# Patient Record
Sex: Male | Born: 1985 | Hispanic: No | Marital: Single | Smoking: Current every day smoker
Health system: Southern US, Community
[De-identification: ages and names within clinical notes are randomized; demographics above are authoritative.]

---
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
## 2005-01-29 ENCOUNTER — Emergency Department (HOSPITAL_COMMUNITY): Admission: EM | Admit: 2005-01-29 | Discharge: 2005-01-29 | Payer: Self-pay | Admitting: Emergency Medicine

## 2005-02-18 ENCOUNTER — Emergency Department (HOSPITAL_COMMUNITY): Admission: EM | Admit: 2005-02-18 | Discharge: 2005-02-18 | Payer: Self-pay | Admitting: Family Medicine

## 2005-02-27 ENCOUNTER — Ambulatory Visit (HOSPITAL_COMMUNITY): Admission: RE | Admit: 2005-02-27 | Discharge: 2005-02-27 | Payer: Self-pay | Admitting: Orthopaedic Surgery

## 2006-02-07 ENCOUNTER — Emergency Department (HOSPITAL_COMMUNITY): Admission: EM | Admit: 2006-02-07 | Discharge: 2006-02-07 | Payer: Self-pay | Admitting: Family Medicine

## 2006-04-12 ENCOUNTER — Emergency Department (HOSPITAL_COMMUNITY): Admission: EM | Admit: 2006-04-12 | Discharge: 2006-04-12 | Payer: Self-pay | Admitting: Family Medicine

## 2007-09-02 ENCOUNTER — Emergency Department (HOSPITAL_COMMUNITY): Admission: EM | Admit: 2007-09-02 | Discharge: 2007-09-02 | Payer: Self-pay | Admitting: Emergency Medicine

## 2007-11-30 ENCOUNTER — Emergency Department (HOSPITAL_COMMUNITY): Admission: EM | Admit: 2007-11-30 | Discharge: 2007-11-30 | Payer: Self-pay | Admitting: Emergency Medicine

## 2008-08-14 ENCOUNTER — Emergency Department (HOSPITAL_COMMUNITY): Admission: EM | Admit: 2008-08-14 | Discharge: 2008-08-14 | Payer: Self-pay | Admitting: Family Medicine

## 2008-11-21 ENCOUNTER — Emergency Department (HOSPITAL_COMMUNITY): Admission: EM | Admit: 2008-11-21 | Discharge: 2008-11-21 | Payer: Self-pay | Admitting: Family Medicine

## 2009-01-16 ENCOUNTER — Emergency Department (HOSPITAL_COMMUNITY): Admission: EM | Admit: 2009-01-16 | Discharge: 2009-01-16 | Payer: Self-pay | Admitting: Emergency Medicine

## 2009-10-14 ENCOUNTER — Emergency Department (HOSPITAL_COMMUNITY): Admission: EM | Admit: 2009-10-14 | Discharge: 2009-10-14 | Payer: Self-pay | Admitting: Family Medicine

## 2010-04-17 ENCOUNTER — Emergency Department (HOSPITAL_COMMUNITY): Admission: EM | Admit: 2010-04-17 | Discharge: 2010-04-17 | Payer: Self-pay | Admitting: Family Medicine

## 2010-09-12 LAB — POCT URINALYSIS DIPSTICK
Bilirubin Urine: NEGATIVE
Glucose, UA: NEGATIVE mg/dL
Hgb urine dipstick: NEGATIVE
Nitrite: NEGATIVE
Protein, ur: NEGATIVE mg/dL
Specific Gravity, Urine: 1.02 (ref 1.005–1.030)
Urobilinogen, UA: 1 mg/dL (ref 0.0–1.0)
pH: 7 (ref 5.0–8.0)

## 2010-09-12 LAB — GC/CHLAMYDIA PROBE AMP, GENITAL
Chlamydia, DNA Probe: NEGATIVE
GC Probe Amp, Genital: NEGATIVE

## 2010-10-15 ENCOUNTER — Inpatient Hospital Stay (INDEPENDENT_AMBULATORY_CARE_PROVIDER_SITE_OTHER)
Admission: RE | Admit: 2010-10-15 | Discharge: 2010-10-15 | Disposition: A | Discharge status: Home / Self Care | Payer: 59 | Source: Ambulatory Visit | Attending: Emergency Medicine | Admitting: Emergency Medicine

## 2010-10-15 DIAGNOSIS — J40 Bronchitis, not specified as acute or chronic: Secondary | ICD-10-CM

## 2010-10-15 DIAGNOSIS — J019 Acute sinusitis, unspecified: Secondary | ICD-10-CM

## 2010-10-29 ENCOUNTER — Emergency Department (HOSPITAL_COMMUNITY): Payer: 59

## 2010-10-29 ENCOUNTER — Emergency Department (HOSPITAL_COMMUNITY)
Admission: EM | Admit: 2010-10-29 | Discharge: 2010-10-30 | Disposition: A | Discharge status: Home / Self Care | Payer: 59 | Attending: Emergency Medicine | Admitting: Emergency Medicine

## 2010-10-29 DIAGNOSIS — M79609 Pain in unspecified limb: Secondary | ICD-10-CM | POA: Insufficient documentation

## 2010-10-29 DIAGNOSIS — IMO0002 Reserved for concepts with insufficient information to code with codable children: Secondary | ICD-10-CM | POA: Insufficient documentation

## 2010-10-29 DIAGNOSIS — S1093XA Contusion of unspecified part of neck, initial encounter: Secondary | ICD-10-CM | POA: Insufficient documentation

## 2010-10-29 DIAGNOSIS — R51 Headache: Secondary | ICD-10-CM | POA: Insufficient documentation

## 2010-10-29 DIAGNOSIS — S62309A Unspecified fracture of unspecified metacarpal bone, initial encounter for closed fracture: Secondary | ICD-10-CM | POA: Insufficient documentation

## 2010-10-29 DIAGNOSIS — S0003XA Contusion of scalp, initial encounter: Secondary | ICD-10-CM | POA: Insufficient documentation

## 2010-10-29 DIAGNOSIS — M25519 Pain in unspecified shoulder: Secondary | ICD-10-CM | POA: Insufficient documentation

## 2010-10-29 DIAGNOSIS — J45909 Unspecified asthma, uncomplicated: Secondary | ICD-10-CM | POA: Insufficient documentation

## 2010-10-29 LAB — POCT I-STAT, CHEM 8
BUN: 9 mg/dL (ref 6–23)
Calcium, Ion: 1.06 mmol/L — ABNORMAL LOW (ref 1.12–1.32)
Chloride: 109 mEq/L (ref 96–112)
Creatinine, Ser: 1 mg/dL (ref 0.4–1.5)
Glucose, Bld: 115 mg/dL — ABNORMAL HIGH (ref 70–99)
TCO2: 23 mmol/L (ref 0–100)

## 2010-11-09 ENCOUNTER — Ambulatory Visit (HOSPITAL_BASED_OUTPATIENT_CLINIC_OR_DEPARTMENT_OTHER)
Admission: RE | Admit: 2010-11-09 | Discharge: 2010-11-09 | Disposition: A | Discharge status: Home / Self Care | Payer: 59 | Source: Ambulatory Visit | Attending: Orthopedic Surgery | Admitting: Orthopedic Surgery

## 2010-11-09 DIAGNOSIS — X58XXXA Exposure to other specified factors, initial encounter: Secondary | ICD-10-CM | POA: Insufficient documentation

## 2010-11-09 DIAGNOSIS — S62309A Unspecified fracture of unspecified metacarpal bone, initial encounter for closed fracture: Secondary | ICD-10-CM | POA: Insufficient documentation

## 2010-11-09 DIAGNOSIS — Z01812 Encounter for preprocedural laboratory examination: Secondary | ICD-10-CM | POA: Insufficient documentation

## 2010-11-09 LAB — POCT HEMOGLOBIN-HEMACUE: Hemoglobin: 14.1 g/dL (ref 13.0–17.0)

## 2010-11-11 NOTE — Op Note (Signed)
  NAMEBRANDO, Bradley Patterson             ACCOUNT NO.:  [id number]  MEDICAL RECORD NO.:  [id number]           PATIENT TYPE:  LOCATION:                                 FACILITY:  PHYSICIAN:  Artist Pais. Jerimyah Vandunk, M.D.DATE OF BIRTH:  [birth year]  DATE OF PROCEDURE:  11/09/2010 DATE OF DISCHARGE:                              OPERATIVE REPORT   PREOPERATIVE DIAGNOSIS:  Displaced left long ring finger metacarpal fractures.  POSTOPERATIVE DIAGNOSIS:  Displaced left long ring finger metacarpal fractures.  PROCEDURE:  Open reduction and internal fixation of the long and ring metacarpal fractures, left hand with 1.5-mm lag screws and neutralization plate.  SURGEON:  Artist Pais. Mina Marble, MD  ASSISTANT:  None.  ANESTHESIA:  General.  TOURNIQUET TIME:  One hour and 43 minutes.  COMPLICATIONS:  None.  DRAINS:  None.  PROCEDURE:  The patient was taken to the operating suite, after induction of adequate general anesthesia, left upper extremity was prepped and draped in usual sterile fashion.  An Esmarch was used to exsanguinate the limb.  Tourniquet was then inflated to 250 mmHg.  At this point in time, incision was made on the dorsal aspect of his left hand in an S fashion.  Flaps were raised accordingly.  Interval between the long and ring metacarpals was dissected down to the extensor mechanism.  This was carefully retracted and the periosteum over the long metacarpal was incised.  Fracture site was identified.  There was a complicated spiral oblique fracture of the metacarpal with comminution. The lag screws were placed initially followed by neutralization plate on the radial side.  Intraoperative fluoroscopy revealed adequate reduction in AP lateral and oblique view.  The incision was made over the periosteum of the ring metacarpal and the same procedure was performed using 1.5-mm lag screws and neutralization plate as well as 1.3 mm lag screw from ulnar to radial.  The wound was then  thoroughly irrigated. Intraoperative fluoroscopy revealed adequate reduction in AP lateral and oblique view.  The periosteum was closed with 4-0 Vicryl and the skin with a running 4-0 Vicryl Rapide subcuticular stitch.  Steri-Strips, 4x4s, fluffs and volar splint were applied.  The patient tolerated the procedure well and went to recovery room in stable fashion.     Artist Pais Mina Marble, M.D.     MAW/MEDQ  D:  11/09/2010  T:  11/10/2010  Job:  [id number]  Electronically Signed by Dairl Ponder M.D. on 11/11/2010 06:53:59 PM

## 2010-11-16 NOTE — Op Note (Signed)
NAMEMIRZA, FESSEL             ACCOUNT NO.:  [id number]   MEDICAL RECORD NO.:  [id number]          PATIENT TYPE:  AMB   LOCATION:  SDS                          FACILITY:  [facility]   PHYSICIAN:  Mark C. Ophelia Charter, M.D.    DATE OF BIRTH:  1985-08-29   DATE OF PROCEDURE:  02/27/2005  DATE OF DISCHARGE:  02/27/2005                                 OPERATIVE REPORT   PREOPERATIVE DIAGNOSIS:  Right fifth metacarpal shaft fracture.   POSTOPERATIVE DIAGNOSIS:  Right fifth metacarpal shaft fracture.   OPERATION PERFORMED:  Open reduction internal fixation of right fifth  metacarpal shaft fracture.   SURGEON:  Mark C. Ophelia Charter, M.D.   ANESTHESIA:  MAC.   ESTIMATED BLOOD LOSS:  Minimal.   TOURNIQUET TIME:  Less than 30 minutes.   DESCRIPTION OF PROCEDURE:  After induction of general anesthesia,  orotracheal intubation, standard prepping and draping with extremity sheets  and drapes, hand was wrapped with an Esmarch and antibiotics were given.  Incision was made in line with the fifth metacarpal, subperiosteal  dissection was performed after mobilization of the extensor tendon with baby  Homan retractors.  The shaft was reduced, held and a quarter tubular plate,  4-hole, was selected and cortical screws 2 x 10 mm screws that were 10 mm in  length were used times four for fixation.  It was checked under fluoroscopy,  was anatomic position, rotation looked good.  After irrigation, subcutaneous  tissue reapproximated, skin closed and postoperative dressing was applied  with the wrist extended, MCPs flexed and postoperatively placed in a hand  elevator sling.  Intraoperative fluoroscopy pictures were taken.  The  fracture prior to being plated was held with K-wire and after the screws  were inserted, the K-wire was removed.  The fracture was anatomic.  Instrument count and needle count were correct.      Mark C. Ophelia Charter, M.D.  Electronically Signed     MCY/MEDQ  D:  04/03/2005  T:  04/04/2005   Job:  [id number]

## 2010-11-16 NOTE — H&P (Signed)
NAME:  SIMS, LADAY NO.:  [id number]   MEDICAL RECORD NO.:  [id number]                   PATIENT TYPE:  AMB   LOCATION:  DSC                                  FACILITY:  [facility]   PHYSICIAN:  Hermelinda Medicus, M.D.                DATE OF BIRTH:  Aug 24, 1985   DATE OF ADMISSION:  06/27/2003  DATE OF DISCHARGE:                                HISTORY & PHYSICAL   HISTORY OF PRESENT ILLNESS:  This patient is a 25 year old male who has had  history of bilateral myringotomy and tubes years ago.  He has had some at a  later date at age 39, but more recently has had persistent tonsillitis.  He  has been on antibiotics on several occasions and has also had a sleep apnea  problem.  His past history is one of blockage when he sleeps with some  fatigue with some awakening with severe oral irritation.  Tonsils are quite  large.  He has a very narrow oropharynx.  His mom works for a cardiovascular  over at [employer] and has identified this quite carefully.  We, however, felt  that with his age and with not having any abnormal structural problems, that  a polysomnogram is unnecessary.  He also has had considerable tonsillar  problems where he has been on antibiotics, primarily amoxicillin, Augmentin  and at this point he now comes in because of this repetitive problem for a  tonsillectomy.   PAST MEDICAL HISTORY:  His past history is quite unremarkable in the fact  that he had a BMT in March of 2004 and also in 2002.   MEDICATIONS:  He takes no medications at this time.   PHYSICAL EXAMINATION:  VITAL SIGNS:  His weight is now 245 and now is 267.  Blood pressure 128/72, respirations 20, pulse 75.  HEENT:  Ears are clear.  Oral cavity is clear except he has the large  obstructive tonsils.  His larynx, true cords, false cords, epiglottis, base  of tongue are clear of any ulceration or mass.  NECK:  Free of any thyromegaly, cervical adenopathy or mass.  CHEST:  Clear.  No  rales, rhonchi or wheezes.  CARDIOVASCULAR:  __________ murmurs or gallops.  ABDOMEN:  No organomegaly, tenderness or mass.  Obese.  EXTREMITIES:  Unremarkable.   PLAN:  The patient will be able to be discharged as long as he is observed  here in the recovery room and mom is going to be taking close care of him,  wishes to have him go home.   DIAGNOSES:  Tonsillitis with history of snoring and some sleep apnea but  family wishes to have him be discharged.  Hermelinda Medicus, M.D.    JC/MEDQ  D:  06/27/2003  T:  06/27/2003  Job:  [id number]   cc:   L. Lupe Carney, M.D.  [address] [place]  [place] [address]  Fax: [phone]

## 2010-11-16 NOTE — Op Note (Signed)
NAME:  Bradley Patterson, Bradley Patterson                       ACCOUNT NO.:  [id number]   MEDICAL RECORD NO.:  [id number]                   PATIENT TYPE:  AMB   LOCATION:  DSC                                  FACILITY:  [facility]   PHYSICIAN:  Hermelinda Medicus, M.D.                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 status post pressure equalization tubes, type 1 Paparella.   POSTOPERATIVE DIAGNOSIS:  Status post pressure equalization tubes, type 1  Paparella with the type 1 Paparella tube previously used.   OPERATION:  Bilateral myringotomy and tubes using type II Paparella pressure  equalization tubes.   OPERATOR:  Hermelinda Medicus, M.D.   ANESTHESIA:  General LMA.   PROCEDURE:  The patient was placed in the supine position and under general  LMA anesthesia, the patient's right ear was first approached and showed some  considerable retraction and thinness of the tympanic membrane as the  myringotomy was carried out.  Fluid was suctioned from behind the tympanic  membrane that was mildly cloudy and antibiotics will be also continued  postoperatively.  A type II Paparella PE tube was placed without difficulty,  and then the remainder of the fluid was suctioned from this tube.  Pediotic  drops were placed postoperatively, and cotton in the external ear canal.  On  his left side, he showed a severe retraction also with also a Pirus Lassiter  retraction pocket of moderate severity, no evidence of cholesteatoma, and we  removed the old PE tube that was sitting in the ear canal, cleansed this ear  with Betadine, and then myringotomy was carried out in the anterior-inferior  aspect of the tympanic membrane.  Thick fluid was suction from behind the  tympanic membrane, and  a type II Paparella PE tube was placed.  Pediotic  drops were placed postoperatively, an cotton in the external ear canal.  This is the second round of tubes this fellow has had.  He is 25 years old.  He obviously has very poor eustachian tube function and has used all the  decongestants and nasal drops, etc that we could use.  He is developing an  adhesive otitis problem with a thinning of the tympanic membrane, and we are  just going to watch him very closely.  His family are aware that there is an  increased percentage of permanent perforation by using the type II Paparella  PE tubes, but the type I was present for approximately eight months.  The  patient tolerated the procedure well and was doing well postoperatively.  Hermelinda Medicus, M.D.   JC/MEDQ  D:  07/15/2002  T:  07/15/2002  Job:  [id number]   cc:   L. Lupe Carney, M.D.  [address] [place]  [place] [address]  Fax: [phone]

## 2010-11-16 NOTE — Op Note (Signed)
NAME:  Bradley Patterson, Bradley Patterson                       ACCOUNT NO.:  [id number]   MEDICAL RECORD NO.:  [id number]                   PATIENT TYPE:  AMB   LOCATION:  DSC                                  FACILITY:  [facility]   PHYSICIAN:  Hermelinda Medicus, M.D.                DATE OF BIRTH:  07-01-1986   DATE OF PROCEDURE:  06/27/2003  DATE OF DISCHARGE:                                 OPERATIVE REPORT   PREOPERATIVE DIAGNOSIS:  Tonsillitis with history of snoring with  questionable  history of sleep apnea.   POSTOPERATIVE DIAGNOSIS:  Tonsillitis with history of snoring with  questionable  history of sleep apnea.   PROCEDURE:  Tonsillectomy.   SURGEON:  Hermelinda Medicus, M.D.   ANESTHESIA:  General endotracheal anesthesia by Maren Beach, M.D.   DESCRIPTION OF PROCEDURE:  The patient was placed in the supine position.  Under general endotracheal anesthesia the tonsils were removed using blunt  and Bovie electrocoagulation for hemostasis and dissection. The tonsillar  beds were carefully evaluated and all bleeders were brought well under  control. The nasopharynx was suctioned as was the stomach  and the tonsillar  gag was carefully, slowly removed, and as we removed this no further  bleeding was noted.  The inferior and superior poles were carefully evaluated, and then the  patient was taken to the recovery room.   The patient is advised he cannot travel for 10 days, especially beach or  mountain trips. He was advised as was his mother about the soft diet. The  patient is doing well postoperatively. He is to follow up in 10 days, 3  weeks, 5 weeks and 7 weeks.                                               Hermelinda Medicus, M.D.    JC/MEDQ  D:  06/27/2003  T:  06/27/2003  Job:  [id number]   cc:   L. Lupe Carney, M.D.  [address] [place]  [place] [address]  Fax: [phone]   Donia Guiles, M.D.  [address] [place]  [place] [address]  Fax: [phone]

## 2011-01-11 ENCOUNTER — Inpatient Hospital Stay (INDEPENDENT_AMBULATORY_CARE_PROVIDER_SITE_OTHER)
Admission: RE | Admit: 2011-01-11 | Discharge: 2011-01-11 | Disposition: A | Discharge status: Home / Self Care | Payer: 59 | Source: Ambulatory Visit | Attending: Family Medicine | Admitting: Family Medicine

## 2011-01-11 DIAGNOSIS — J45909 Unspecified asthma, uncomplicated: Secondary | ICD-10-CM
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
## 2017-09-28 ENCOUNTER — Encounter (HOSPITAL_COMMUNITY): Payer: Self-pay | Admitting: Family Medicine

## 2017-09-28 ENCOUNTER — Ambulatory Visit (HOSPITAL_COMMUNITY)
Admission: EM | Admit: 2017-09-28 | Discharge: 2017-09-28 | Disposition: A | Discharge status: Home / Self Care | Payer: 59 | Attending: Physician Assistant | Admitting: Physician Assistant

## 2017-09-28 DIAGNOSIS — H938X2 Other specified disorders of left ear: Secondary | ICD-10-CM

## 2017-09-28 MED ORDER — IPRATROPIUM BROMIDE 0.06 % NA SOLN: 2.0000 | Freq: Four times a day (QID) | NASAL | 0 refills | Status: AC

## 2017-09-28 MED ORDER — CETIRIZINE HCL 10 MG PO TABS: 10.0000 mg | ORAL_TABLET | Freq: Every day | ORAL | 0 refills | Status: AC

## 2017-09-28 MED ORDER — PREDNISONE 20 MG PO TABS: 40.0000 mg | ORAL_TABLET | Freq: Every day | ORAL | 0 refills | Status: AC

## 2017-09-28 MED ORDER — FLUTICASONE PROPIONATE 50 MCG/ACT NA SUSP: 2.0000 | Freq: Every day | NASAL | 0 refills | Status: AC
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
## 2017-09-28 NOTE — ED Triage Notes (Signed)
Pt here for left ear fullness x 8 months. Not much pain. He has taken abx in the past with no relief. Also Flonase and ear drops. Hx of tubes.

## 2017-09-28 NOTE — Discharge Instructions (Signed)
Continue zyrtec. Start flonase and atrovent nasal spray. Prednisone as directed. As discussed, ear fullness/ringing could be due to ear drum retraction. However, if symptoms does not improve with treatment, please follow up with ear nose and throat for further evaluation. If experiencing worsening symptoms, dizziness, weakness, passing out, imbalance, seizures, confusion/altered mental status, please go to the emergency department for further evaluation.

## 2018-04-29 ENCOUNTER — Ambulatory Visit (HOSPITAL_COMMUNITY)
Admission: EM | Admit: 2018-04-29 | Discharge: 2018-04-29 | Disposition: A | Discharge status: Home / Self Care | Payer: Self-pay | Attending: Family Medicine | Admitting: Family Medicine

## 2018-04-29 ENCOUNTER — Other Ambulatory Visit: Payer: Self-pay

## 2018-04-29 ENCOUNTER — Encounter (HOSPITAL_COMMUNITY): Payer: Self-pay | Admitting: Emergency Medicine

## 2018-04-29 DIAGNOSIS — T148XXA Other injury of unspecified body region, initial encounter: Secondary | ICD-10-CM

## 2018-04-29 DIAGNOSIS — M25471 Effusion, right ankle: Secondary | ICD-10-CM

## 2018-04-29 DIAGNOSIS — R238 Other skin changes: Secondary | ICD-10-CM

## 2018-04-29 DIAGNOSIS — M25473 Effusion, unspecified ankle: Secondary | ICD-10-CM

## 2018-04-29 MED ORDER — MUPIROCIN 2 % EX OINT: 1.0000 "application " | TOPICAL_OINTMENT | Freq: Two times a day (BID) | CUTANEOUS | 0 refills | Status: AC

## 2018-04-29 NOTE — ED Triage Notes (Signed)
Pt had an ankle monitor on his right ankle that was placed last Wednesday.  Pt stated it was too tight.  He had it changed to his left ankle yesterday.  Pt has redness and swelling to the right ankle and swelling to the right foot.
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
## 2019-05-24 ENCOUNTER — Ambulatory Visit (HOSPITAL_COMMUNITY)
Admission: EM | Admit: 2019-05-24 | Discharge: 2019-05-24 | Disposition: A | Discharge status: Home / Self Care | Payer: Medicaid Other | Attending: Family Medicine | Admitting: Family Medicine

## 2019-05-24 ENCOUNTER — Encounter (HOSPITAL_COMMUNITY): Payer: Self-pay

## 2019-05-24 ENCOUNTER — Other Ambulatory Visit: Payer: Self-pay

## 2019-05-24 DIAGNOSIS — S46811A Strain of other muscles, fascia and tendons at shoulder and upper arm level, right arm, initial encounter: Secondary | ICD-10-CM

## 2019-05-24 MED ORDER — IBUPROFEN 800 MG PO TABS: 800.0000 mg | ORAL_TABLET | Freq: Three times a day (TID) | ORAL | 0 refills | Status: AC

## 2019-05-24 MED ORDER — CYCLOBENZAPRINE HCL 5 MG PO TABS: 5.0000 mg | ORAL_TABLET | Freq: Two times a day (BID) | ORAL | 0 refills | Status: AC | PRN

## 2019-05-24 MED ORDER — KETOROLAC TROMETHAMINE 60 MG/2ML IM SOLN
60.0000 mg | Freq: Once | INTRAMUSCULAR | Status: AC
  Administered 2019-05-24: 13:00:00 60 mg via INTRAMUSCULAR

## 2019-05-24 MED ORDER — PREDNISONE 10 MG PO TABS: ORAL_TABLET | ORAL | 0 refills | Status: AC

## 2019-05-24 MED ORDER — KETOROLAC TROMETHAMINE 60 MG/2ML IM SOLN
INTRAMUSCULAR | Status: AC
  Filled 2019-05-24: qty 2

## 2019-05-24 NOTE — Discharge Instructions (Addendum)
We gave you a shot of Toradol today Begin prednisone taper over the next 6 days.  Begin with 6 tablets, decrease by 1 tablet each day until you finish.  Take with food and in the morning if you are able. After finishing prednisone you may use ibuprofen and Tylenol, avoid ibuprofen and prednisone together as this will increase risk of stomach bleed/ulcer. You may use flexeril with the above medicines as needed to help with pain. This is a muscle relaxer and causes sedation- please use only at bedtime or when you will be home and not have to drive/work Gentle stretching of neck and shoulder to work out tension and regain full range of motion Alternate ice and heat  Follow-up if symptoms not resolving or worsening

## 2019-05-24 NOTE — ED Provider Notes (Signed)
[facility]    CSN: [id number] Arrival date & time: 05/24/19  1122      History   Chief Complaint Chief Complaint  Patient presents with   Shoulder Pain    HPI Bradley Patterson is a 33 y.o. male no significant past medical history presenting today for evaluation of right shoulder and neck pain.  Patient states that he has had pain in this area for the past 3 weeks.  States that he woke up like this.  Denies any injury, fall increase in activity or heavy lifting.  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  He has been using Tylenol without relief.  Denies numbness or tingling.  Denies fever.  HPI  History reviewed. No pertinent past medical history.  There are no active problems to display for this patient.   History reviewed. No pertinent surgical history.     Home Medications    Prior to Admission medications   Medication Sig Start Date End Date Taking? Authorizing Provider  cetirizine (ZYRTEC) 10 MG tablet Take 1 tablet (10 mg total) by mouth daily. 09/28/17   Cathie HoopsYu, Amy V, PA-C  cyclobenzaprine (FLEXERIL) 5 MG tablet Take 1-2 tablets (5-10 mg total) by mouth 2 (two) times daily as needed for muscle spasms. 05/24/19   Asa Fath C, PA-C  fluticasone (FLONASE) 50 MCG/ACT nasal spray Place 2 sprays into both nostrils daily. 09/28/17   Cathie HoopsYu, Amy V, PA-C  ibuprofen (ADVIL) 800 MG tablet Take 1 tablet (800 mg total) by mouth 3 (three) times daily. Begin after course of prednisone 05/24/19   Devany Aja C, PA-C  ipratropium (ATROVENT) 0.06 % nasal spray Place 2 sprays into both nostrils 4 (four) times daily. 09/28/17   Cathie HoopsYu, Amy V, PA-C  mupirocin ointment (BACTROBAN) 2 % Apply 1 application topically 2 (two) times daily. 04/29/18   Mardella LaymanHagler, Brian, MD  predniSONE (DELTASONE) 10 MG tablet 6 tab on day 1, 5 tab on day 2, 4 tab on day 3, 3 tab on day 4, 2 tab on day 5, 1 tab on day 6, take with  food 05/24/19   Skyler Dusing, [employer] SystemHallie C, PA-C    Family History Family History  Problem Relation Age of Onset   Diabetes Mother    Hypertension Father    Hyperlipidemia Father    Healthy Sister     Social History Social History   Tobacco Use   Smoking status: Current Every Day Smoker    Packs/day: 0.50    Types: Cigarettes   Smokeless tobacco: Never Used  Substance Use Topics   Alcohol use: Not Currently   Drug use: Yes    Types: Marijuana     Allergies   Patient has no known allergies.   Review of Systems Review of Systems  Constitutional: Negative for fatigue and fever.  Eyes: Negative for redness, itching and visual disturbance.  Respiratory: Negative for shortness of breath.   Cardiovascular: Negative for chest pain and leg swelling.  Gastrointestinal: Negative for nausea and vomiting.  Musculoskeletal: Positive for myalgias and neck pain. Negative for arthralgias.  Skin: Negative for color change, rash and wound.  Neurological: Negative for dizziness, syncope, weakness, light-headedness and headaches.     Physical Exam Triage Vital Signs ED Triage Vitals  Enc Vitals Group     BP 05/24/19 1211 110/71     Pulse Rate 05/24/19 1211 78     Resp 05/24/19 1211  18     Temp 05/24/19 1211 98.7 F (37.1 C)     Temp Source 05/24/19 1211 Oral     SpO2 05/24/19 1211 100 %     Weight 05/24/19 1209 250 lb (113.4 kg)     Height --      Head Circumference --      Peak Flow --      Pain Score 05/24/19 1209 10     Pain Loc --      Pain Edu? --      Excl. in Max? --    No data found.  Updated Vital Signs BP 110/71 (BP Location: Right Arm)    Pulse 78    Temp 98.7 F (37.1 C) (Oral)    Resp 18    Wt 250 lb (113.4 kg)    SpO2 100%   Visual Acuity Right Eye Distance:   Left Eye Distance:   Bilateral Distance:    Right Eye Near:   Left Eye Near:    Bilateral Near:     Physical Exam Vitals signs and nursing note reviewed.  Constitutional:      Appearance:  He is well-developed.     Comments: No acute distress  HENT:     Head: Normocephalic and atraumatic.     Nose: Nose normal.  Eyes:     Conjunctiva/sclera: Conjunctivae normal.  Neck:     Musculoskeletal: Neck supple.     Comments: Full active range of motion of neck, pain with leftward rotation Cardiovascular:     Rate and Rhythm: Normal rate.  Pulmonary:     Effort: Pulmonary effort is normal. No respiratory distress.  Abdominal:     General: There is no distension.  Musculoskeletal: Normal range of motion.     Comments: Nontender to palpation of cervical, thoracic and lumbar spine midline, no palpable deformity or step-off, tenderness throughout right trapezius and cervical musculature extending towards shoulder Full active range of motion of shoulder, strength 5/5 and equal bilaterally  Skin:    General: Skin is warm and dry.  Neurological:     Mental Status: He is alert and oriented to person, place, and time.      UC Treatments / Results  Labs (all labs ordered are listed, but only abnormal results are displayed) Labs Reviewed - No data to display  EKG   Radiology No results found.  Procedures Procedures (including critical care time)  Medications Ordered in UC Medications  ketorolac (TORADOL) injection 60 mg (60 mg Intramuscular Given 05/24/19 1253)  ketorolac (TORADOL) 60 MG/2ML injection (has no administration in time range)    Initial Impression / Assessment and Plan / UC Course  I have reviewed the triage vital signs and the nursing notes.  Pertinent labs & imaging results that were available during my care of the patient were reviewed by me and considered in my medical decision making (see chart for details).    3 weeks of trapezius strain, will provide Toradol prior to discharge, followed by prednisone taper over the next 6 days.  After completion of prednisone may continue with ibuprofen/Tylenol.  No mechanism of injury, do not suspect acute bony  abnormality.  Muscle relaxer as needed, discussed drowsiness in relation with muscle relaxers. Discussed strict return precautions. Patient verbalized understanding and is agreeable with plan.  Final Clinical Impressions(s) / UC Diagnoses   Final diagnoses:  Strain of right trapezius muscle, initial encounter     Discharge Instructions     We gave you  a shot of Toradol today Begin prednisone taper over the next 6 days.  Begin with 6 tablets, decrease by 1 tablet each day until you finish.  Take with food and in the morning if you are able. After finishing prednisone you may use ibuprofen and Tylenol, avoid ibuprofen and prednisone together as this will increase risk of stomach bleed/ulcer. You may use flexeril with the above medicines as needed to help with pain. This is a muscle relaxer and causes sedation- please use only at bedtime or when you will be home and not have to drive/work Gentle stretching of neck and shoulder to work out tension and regain full range of motion Alternate ice and heat  Follow-up if symptoms not resolving or worsening    ED Prescriptions    Medication Sig Dispense Auth. Provider   predniSONE (DELTASONE) 10 MG tablet 6 tab on day 1, 5 tab on day 2, 4 tab on day 3, 3 tab on day 4, 2 tab on day 5, 1 tab on day 6, take with food 21 tablet Neria Procter C, PA-C   ibuprofen (ADVIL) 800 MG tablet Take 1 tablet (800 mg total) by mouth 3 (three) times daily. Begin after course of prednisone 21 tablet Cadyn Rodger C, PA-C   cyclobenzaprine (FLEXERIL) 5 MG tablet Take 1-2 tablets (5-10 mg total) by mouth 2 (two) times daily as needed for muscle spasms. 24 tablet Joyceline Maiorino, [place] C, PA-C     PDMP not reviewed this encounter.   Lew Dawes, PA-C 05/24/19 1404

## 2019-05-24 NOTE — ED Triage Notes (Signed)
Pt states he has neck , shoulder pain that radiating down in to his arm. This has been going on for 2 weeks or more.

## 2020-10-03 ENCOUNTER — Emergency Department (HOSPITAL_COMMUNITY)
Admission: EM | Admit: 2020-10-03 | Discharge: 2020-10-03 | Discharge status: Against Medical Advice | Payer: Medicaid Other | Attending: Emergency Medicine | Admitting: Emergency Medicine

## 2020-10-03 ENCOUNTER — Encounter (HOSPITAL_COMMUNITY): Payer: Self-pay

## 2020-10-03 ENCOUNTER — Other Ambulatory Visit: Payer: Self-pay

## 2020-10-03 DIAGNOSIS — Z5321 Procedure and treatment not carried out due to patient leaving prior to being seen by health care provider: Secondary | ICD-10-CM | POA: Insufficient documentation

## 2020-10-03 DIAGNOSIS — R6884 Jaw pain: Secondary | ICD-10-CM | POA: Insufficient documentation

## 2020-10-03 NOTE — ED Provider Notes (Signed)
MSE was initiated and I personally evaluated the patient and placed orders (if any) at  1030 on October 03, 2020.  The patient appears stable so that the remainder of the MSE may be completed by another provider.  Patient right, alert, speaking clearly, sitting upright, in no distress.  He is presenting with left-sided lower jaw pain since yesterday.  No relief with OTC medication.   Gerhard Munch, MD 10/03/20 1037

## 2020-10-03 NOTE — ED Triage Notes (Signed)
Pt c/o left sided dental and jaw pain x 2 days. Pt cannot see dentist until thurs so decided to come here. Pt has cavity on upper left, and c/o possible abscess on bottom

## 2020-10-03 NOTE — ED Triage Notes (Signed)
Called for triage/fast track no answer

## 2020-12-20 ENCOUNTER — Emergency Department (HOSPITAL_COMMUNITY)
Admission: EM | Admit: 2020-12-20 | Discharge: 2020-12-20 | Disposition: A | Discharge status: Home / Self Care | Payer: Self-pay | Attending: Emergency Medicine | Admitting: Emergency Medicine

## 2020-12-20 ENCOUNTER — Encounter (HOSPITAL_COMMUNITY): Payer: Self-pay | Admitting: Emergency Medicine

## 2020-12-20 ENCOUNTER — Emergency Department (HOSPITAL_COMMUNITY): Payer: Self-pay

## 2020-12-20 ENCOUNTER — Other Ambulatory Visit: Payer: Self-pay

## 2020-12-20 DIAGNOSIS — M25521 Pain in right elbow: Secondary | ICD-10-CM | POA: Insufficient documentation

## 2020-12-20 DIAGNOSIS — S62324A Displaced fracture of shaft of fourth metacarpal bone, right hand, initial encounter for closed fracture: Secondary | ICD-10-CM | POA: Insufficient documentation

## 2020-12-20 DIAGNOSIS — W2209XA Striking against other stationary object, initial encounter: Secondary | ICD-10-CM | POA: Insufficient documentation

## 2020-12-20 DIAGNOSIS — F17210 Nicotine dependence, cigarettes, uncomplicated: Secondary | ICD-10-CM | POA: Insufficient documentation

## 2020-12-20 DIAGNOSIS — S62339A Displaced fracture of neck of unspecified metacarpal bone, initial encounter for closed fracture: Secondary | ICD-10-CM

## 2020-12-20 MED ORDER — OXYCODONE-ACETAMINOPHEN 5-325 MG PO TABS: 1.0000 | ORAL_TABLET | ORAL | 0 refills | Status: AC | PRN

## 2020-12-20 MED ORDER — OXYCODONE-ACETAMINOPHEN 5-325 MG PO TABS
2.0000 | ORAL_TABLET | Freq: Once | ORAL | Status: AC
  Administered 2020-12-20: 2 via ORAL
  Filled 2020-12-20: qty 2

## 2020-12-20 NOTE — ED Triage Notes (Signed)
Pt arriving with right hand injury. Pt reports he broke it previously and has metal in his hand from previous repair. Pt has swelling to hand and limited movement.

## 2020-12-20 NOTE — ED Notes (Signed)
Ortho tech called 

## 2020-12-20 NOTE — Progress Notes (Signed)
Orthopedic Tech Progress Note Patient Details:  SHAYA ALTAMURA 02-14-1986 [id number]  Ortho Devices Type of Ortho Device: Ulna gutter splint Ortho Device/Splint Location: RUE Ortho Device/Splint Interventions: Ordered, Application, Adjustment   Post Interventions Patient Tolerated: Well Instructions Provided: Care of device, Poper ambulation with device  Achille Xiang 12/20/2020, 6:01 AM

## 2020-12-20 NOTE — Discharge Instructions (Addendum)
Please follow-up with the hand surgeon.  Please take pain medication as prescribed.  Do not hit walls.

## 2020-12-20 NOTE — ED Provider Notes (Signed)
[facility]GENC[facility]PT Provider Note   CSN: [id number] Arrival date & time: 12/20/20  0040     History Chief Complaint  Patient presents with   Hand Injury    Right    Bradley Patterson is a 35 y.o. male.  Patient presents to the emergency department with a chief complaint of right hand injury.  He states that he punched a wall.  He reports associated pain and swelling.  It is worsened with movement and palpation.  Denies any treatment prior to arrival.  He also complains of mild right elbow pain.  The history is provided by the patient. No language interpreter was used.      History reviewed. No pertinent past medical history.  There are no problems to display for this patient.   History reviewed. No pertinent surgical history.     Family History  Problem Relation Age of Onset   Diabetes Mother    Hypertension Father    Hyperlipidemia Father    Healthy Sister     Social History   Tobacco Use   Smoking status: Every Day    Packs/day: 0.50    Pack years: 0.00    Types: Cigarettes   Smokeless tobacco: Never  Vaping Use   Vaping Use: Never used  Substance Use Topics   Alcohol use: Not Currently   Drug use: Yes    Types: Marijuana    Home Medications Prior to Admission medications   Medication Sig Start Date End Date Taking? Authorizing Provider  cetirizine (ZYRTEC) 10 MG tablet Take 1 tablet (10 mg total) by mouth daily. 09/28/17   Cathie Hoops, Amy V, PA-C  cyclobenzaprine (FLEXERIL) 5 MG tablet Take 1-2 tablets (5-10 mg total) by mouth 2 (two) times daily as needed for muscle spasms. 05/24/19   Wieters, Hallie C, PA-C  fluticasone (FLONASE) 50 MCG/ACT nasal spray Place 2 sprays into both nostrils daily. 09/28/17   Cathie Hoops, Amy V, PA-C  ibuprofen (ADVIL) 800 MG tablet Take 1 tablet (800 mg total) by mouth 3 (three) times daily. Begin after course of prednisone 05/24/19   Wieters, Hallie C, PA-C  ipratropium (ATROVENT) 0.06 % nasal spray Place 2  sprays into both nostrils 4 (four) times daily. 09/28/17   Cathie Hoops, Amy V, PA-C  mupirocin ointment (BACTROBAN) 2 % Apply 1 application topically 2 (two) times daily. 04/29/18   Mardella Layman, MD  predniSONE (DELTASONE) 10 MG tablet 6 tab on day 1, 5 tab on day 2, 4 tab on day 3, 3 tab on day 4, 2 tab on day 5, 1 tab on day 6, take with food 05/24/19   Wieters, McEwen C, PA-C    Allergies    Patient has no known allergies.  Review of Systems   Review of Systems  All other systems reviewed and are negative.  Physical Exam Updated Vital Signs BP (!) 141/99 (BP Location: Left Arm)   Pulse 100   Temp 98.1 F (36.7 C) (Oral)   Resp 16   SpO2 100%   Physical Exam Vitals and nursing note reviewed.  Constitutional:      General: He is not in acute distress.    Appearance: He is well-developed. He is not ill-appearing.  HENT:     Head: Normocephalic and atraumatic.  Eyes:     Conjunctiva/sclera: Conjunctivae normal.  Cardiovascular:     Rate and Rhythm: Normal rate.  Pulmonary:     Effort: Pulmonary effort is normal. No respiratory distress.  Abdominal:  General: There is no distension.  Musculoskeletal:     Cervical back: Neck supple.     Comments: Swelling, deformity, tenderness to palpation of the right hand overlying the fourth and fifth metacarpals  Skin:    General: Skin is warm and dry.  Neurological:     Mental Status: He is alert and oriented to person, place, and time.  Psychiatric:        Mood and Affect: Mood normal.        Behavior: Behavior normal.    ED Results / Procedures / Treatments   Labs (all labs ordered are listed, but only abnormal results are displayed) Labs Reviewed - No data to display  EKG None  Radiology DG Hand Complete Right  Result Date: 12/20/2020 CLINICAL DATA:  Punched brick wall this morning. Pain to third through fifth metacarpal regions. EXAM: RIGHT HAND - COMPLETE 3+ VIEW COMPARISON:  None. FINDINGS: There is marked soft tissue  swelling overlying the fifth metacarpal bone. Previous plate and screw fixation of the fifth metacarpal bone. Comminuted, intra-articular impacted fracture involves the distal aspect of the fourth metacarpal bone. Acute fracture deformity is also suspected involving the head of the fifth metacarpal bone with volar angulation of the distal fracture fragments. IMPRESSION: 1. Acute, comminuted, intra-articular fracture involves the distal aspect of the fourth metacarpal bone. 2. Suspect acute fracture deformity involving the head of the fifth metacarpal bone. 3. Previous plate and screw fixation of the mid shaft of fifth metacarpal bone. Electronically Signed   By: Signa Kell M.D.   On: 12/20/2020 03:28    Procedures Procedures   Medications Ordered in ED Medications  oxyCODONE-acetaminophen (PERCOCET/ROXICET) 5-325 MG per tablet 2 tablet (has no administration in time range)    ED Course  I have reviewed the triage vital signs and the nursing notes.  Pertinent labs & imaging results that were available during my care of the patient were reviewed by me and considered in my medical decision making (see chart for details).    MDM Rules/Calculators/A&P                          Patient with right hand injury.  Plain films reveal fractures of the fourth and fifth metacarpal.  Will need referral to hand.  We will place patient in an ulnar gutter splint and discharged with pain medicine.  Prior to discharge patient also states that he has right elbow pain.  Will check imaging of this.   Imaging of right elbow is negative.  Hand surgery follow-up.   Final Clinical Impression(s) / ED Diagnoses Final diagnoses:  Closed boxer's fracture, initial encounter    Rx / DC Orders ED Discharge Orders     None        Roxy Horseman, PA-C 12/20/20 6546    Dione Booze, MD 12/20/20 [phone]

## 2021-01-08 ENCOUNTER — Other Ambulatory Visit: Payer: Self-pay | Admitting: Orthopedic Surgery

## 2022-11-05 ENCOUNTER — Telehealth: Payer: Self-pay

## 2022-11-05 NOTE — Telephone Encounter (Signed)
Attempted to contact patient-no working #. AS, CMA
